# Patient Record
Sex: Female | Born: 1990 | Race: Black or African American | Hispanic: No | Marital: Single | Smoking: Never smoker
Health system: Southern US, Community
[De-identification: ages and names within clinical notes are randomized; demographics above are authoritative.]

## PROBLEM LIST (undated history)

## (undated) DIAGNOSIS — J45909 Unspecified asthma, uncomplicated: Secondary | ICD-10-CM

## (undated) HISTORY — PX: MOUTH SURGERY: SHX715

---
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
## 2013-01-06 ENCOUNTER — Emergency Department: Payer: Self-pay | Admitting: Emergency Medicine

## 2013-01-06 LAB — URINALYSIS, COMPLETE
Bacteria: NONE SEEN
Glucose,UR: NEGATIVE mg/dL (ref 0–75)
Leukocyte Esterase: NEGATIVE
Nitrite: NEGATIVE
RBC,UR: 14 /HPF (ref 0–5)
WBC UR: 1 /HPF (ref 0–5)

## 2013-01-06 LAB — CBC
HCT: 39.7 % (ref 35.0–47.0)
HGB: 13.6 g/dL (ref 12.0–16.0)
MCH: 31.6 pg (ref 26.0–34.0)
MCV: 92 fL (ref 80–100)
RDW: 12 % (ref 11.5–14.5)
WBC: 5 10*3/uL (ref 3.6–11.0)

## 2013-01-06 LAB — COMPREHENSIVE METABOLIC PANEL
Albumin: 4 g/dL (ref 3.4–5.0)
BUN: 10 mg/dL (ref 7–18)
Bilirubin,Total: 0.4 mg/dL (ref 0.2–1.0)
EGFR (Non-African Amer.): >60
Potassium: 4.3 mmol/L (ref 3.5–5.1)
SGOT(AST): 78 U/L — ABNORMAL HIGH (ref 15–37)
SGPT (ALT): 141 U/L — ABNORMAL HIGH (ref 12–78)
Sodium: 138 mmol/L (ref 136–145)

## 2013-01-25 ENCOUNTER — Emergency Department: Payer: Self-pay | Admitting: Emergency Medicine

## 2013-01-25 LAB — COMPREHENSIVE METABOLIC PANEL
Anion Gap: 6 — ABNORMAL LOW (ref 7–16)
Bilirubin,Total: 0.4 mg/dL (ref 0.2–1.0)
Calcium, Total: 9.2 mg/dL (ref 8.5–10.1)
Co2: 27 mmol/L (ref 21–32)
Creatinine: 1.02 mg/dL (ref 0.60–1.30)
EGFR (African American): >60
EGFR (Non-African Amer.): >60
Glucose: 94 mg/dL (ref 65–99)
Osmolality: 277 (ref 275–301)
SGOT(AST): 41 U/L — ABNORMAL HIGH (ref 15–37)
Sodium: 139 mmol/L (ref 136–145)
Total Protein: 9.1 g/dL — ABNORMAL HIGH (ref 6.4–8.2)

## 2013-01-25 LAB — URINALYSIS, COMPLETE
Bilirubin,UR: NEGATIVE
Glucose,UR: NEGATIVE mg/dL (ref 0–75)
Nitrite: NEGATIVE
Ph: 5 (ref 4.5–8.0)
Protein: 100
RBC,UR: 28 /HPF (ref 0–5)
Squamous Epithelial: 20
WBC UR: 97 /HPF (ref 0–5)

## 2013-01-25 LAB — CBC
HGB: 13.5 g/dL (ref 12.0–16.0)
MCHC: 33.6 g/dL (ref 32.0–36.0)
MCV: 92 fL (ref 80–100)
Platelet: 372 10*3/uL (ref 150–440)
WBC: 7.6 10*3/uL (ref 3.6–11.0)

## 2013-01-25 LAB — LIPASE, BLOOD: Lipase: 149 U/L (ref 73–393)

## 2013-01-26 LAB — WET PREP, GENITAL

## 2015-02-03 ENCOUNTER — Emergency Department: Payer: Self-pay | Admitting: Emergency Medicine

## 2015-03-28 ENCOUNTER — Emergency Department
Admit: 2015-03-28 | Disposition: A | Discharge status: Home / Self Care | Payer: Self-pay | Admitting: Emergency Medicine

## 2015-10-05 ENCOUNTER — Emergency Department
Admission: EM | Admit: 2015-10-05 | Discharge: 2015-10-05 | Disposition: A | Discharge status: Home / Self Care | Payer: Federal, State, Local not specified - PPO | Attending: Emergency Medicine | Admitting: Emergency Medicine

## 2015-10-05 ENCOUNTER — Encounter: Payer: Self-pay | Admitting: *Deleted

## 2015-10-05 DIAGNOSIS — S0502XA Injury of conjunctiva and corneal abrasion without foreign body, left eye, initial encounter: Secondary | ICD-10-CM | POA: Diagnosis not present

## 2015-10-05 DIAGNOSIS — S0592XA Unspecified injury of left eye and orbit, initial encounter: Secondary | ICD-10-CM | POA: Diagnosis present

## 2015-10-05 DIAGNOSIS — W228XXA Striking against or struck by other objects, initial encounter: Secondary | ICD-10-CM | POA: Insufficient documentation

## 2015-10-05 DIAGNOSIS — Y998 Other external cause status: Secondary | ICD-10-CM | POA: Insufficient documentation

## 2015-10-05 DIAGNOSIS — Y9389 Activity, other specified: Secondary | ICD-10-CM | POA: Insufficient documentation

## 2015-10-05 DIAGNOSIS — Y9289 Other specified places as the place of occurrence of the external cause: Secondary | ICD-10-CM | POA: Diagnosis not present

## 2015-10-05 MED ORDER — TETRACAINE HCL 0.5 % OP SOLN
2.0000 [drp] | Freq: Once | OPHTHALMIC | Status: AC
  Administered 2015-10-05: 2 [drp] via OPHTHALMIC
  Filled 2015-10-05: qty 2

## 2015-10-05 MED ORDER — FLUORESCEIN SODIUM 1 MG OP STRP
1.0000 | ORAL_STRIP | Freq: Once | OPHTHALMIC | Status: AC
  Administered 2015-10-05: 1 via OPHTHALMIC
  Filled 2015-10-05: qty 1

## 2015-10-05 MED ORDER — ERYTHROMYCIN 5 MG/GM OP OINT
1.0000 | TOPICAL_OINTMENT | Freq: Four times a day (QID) | OPHTHALMIC | Status: DC
Start: 2015-10-05 — End: 2016-04-01

## 2015-10-05 MED ORDER — KETOROLAC TROMETHAMINE 0.5 % OP SOLN: 1.0000 [drp] | Freq: Four times a day (QID) | OPHTHALMIC | Status: DC

## 2015-10-05 NOTE — Discharge Instructions (Signed)

## 2015-10-05 NOTE — ED Provider Notes (Signed)
[facility] Emergency Department Provider Note  ____________________________________________  Time seen: Approximately 1:45 PM  I have reviewed the triage vital signs and the nursing notes.   HISTORY  Chief Complaint Eye Pain    HPI Annette Hicks is a 24 y.o. female who presents to the emergency department for evaluation of left eye pain. She states that approximately a week ago she accidentally hit her eye with a makeup wipe and a few hours later began to feel irritation in the eye. She has been to Dr. Sharman Crate twice and is now on steroid drops. She states that the drops are not helping, they are causing her eye to feel dry and more irritated. She also states that she is taking clindamycin for a sinus infection, but that is not helping her eye either.   History reviewed. No pertinent past medical history.  There are no active problems to display for this patient.   Past Surgical History  Procedure Laterality Date  . Mouth surgery      wisdom teeth removal    Current Outpatient Rx  Name  Route  Sig  Dispense  Refill  . erythromycin ophthalmic ointment   Left Eye   Place 1 application into the left eye 4 (four) times daily.   3.5 g   0   . ketorolac (ACULAR) 0.5 % ophthalmic solution   Left Eye   Place 1 drop into the left eye 4 (four) times daily.   5 mL   0     Allergies Iodine  No family history on file.  Social History Social History  Substance Use Topics  . Smoking status: Never Smoker   . Smokeless tobacco: None  . Alcohol Use: No    Review of Systems   Constitutional: No fever/chills Eyes: Visual changes: yes, blurry in the left eye. ENT: No sore throat. Cardiovascular: Denies chest pain. Respiratory: Denies shortness of breath. Gastrointestinal: No abdominal pain.  No nausea, no vomiting.  No diarrhea.  No constipation. Musculoskeletal: Negative for pain. Skin: Negative for rash. Neurological: Negative for  headaches, focal weakness or numbness. Psychiatric:At baseline, no complaint Lymphatic:Swollen nodes-- no Allergic: Seasonal allergies: no 10-point ROS otherwise negative.  ____________________________________________  PHYSICAL EXAM:  VITAL SIGNS: ED Triage Vitals  Enc Vitals Group     BP 10/05/15 1256 119/63 mmHg     Pulse Rate 10/05/15 1256 82     Resp 10/05/15 1256 18     Temp 10/05/15 1256 98.1 F (36.7 C)     Temp Source 10/05/15 1256 Oral     SpO2 10/05/15 1256 98 %     Weight 10/05/15 1256 120 lb (54.432 kg)     Height 10/05/15 1256  (1.651 m)     Head Cir --      Peak Flow --      Pain Score 10/05/15 1257 8     Pain Loc --      Pain Edu? --      Excl. in GC? --     Constitutional: Alert and oriented. Well appearing and in no acute distress. Eyes: Visual acuity--see nursing documentation; No globe trauma; Eyelids normal to inspection; Everted for exam yes; Conjunctiva and sclera: Erythematous; Corneas: Tiny ulcerations noted in the 9-12 position; Examined with fluorescein yes; EOM's intact; Pupils PERRLA; Anterior Chambers normal with limited exam.  Head: Atraumatic. Nose: No congestion/rhinnorhea. Mouth/Throat: Mucous membranes are moist.  Oropharynx non-erythematous. Neck: No stridor.  Cardiovascular: Normal rate, regular rhythm. Grossly normal  heart sounds.  Good peripheral circulation. Respiratory: Normal respiratory effort.  No retractions. Gastrointestinal: Soft and nontender. No distention. No abdominal bruits. No CVA tenderness. Musculoskeletal:Normal ROM Neurologic:  Normal speech and language. No gross focal neurologic deficits are appreciated. Speech is normal. No gait instability. Skin:  Skin is warm, dry and intact. No rash noted. Psychiatric: Mood and affect are normal. Speech and behavior are normal.  ____________________________________________   LABS (all labs ordered are listed, but only abnormal results are displayed)  Labs Reviewed  - No data to display ____________________________________________  EKG   ____________________________________________  RADIOLOGY  Not indicated. ____________________________________________   PROCEDURES  Procedure(s) performed: None  ____________________________________________   INITIAL IMPRESSION / ASSESSMENT AND PLAN / ED COURSE  Pertinent labs & imaging results that were available during my care of the patient were reviewed by me and considered in my medical decision making (see chart for details).   Patient was advised to follow up with Dr. Sharman CrateBrassington for symptoms that are not improving over the next 2-3 days. She was  also advised to return to the ER for symptoms that change or worsen if unable to schedule an appointment.  ____________________________________________   FINAL CLINICAL IMPRESSION(S) / ED DIAGNOSES  Final diagnoses:  Corneal abrasion, left, initial encounter       Chinita PesterCari B Giancarlo Askren, FNP 10/05/15 1752  Jennye MoccasinBrian S Quigley, MD 10/09/15 [phone]

## 2015-10-05 NOTE — ED Notes (Signed)
Patient c/o pain/redness to left eye that has been present for one week. Patient states now she has a sensitivity to light in the left eye and increased pain when looking to the left. Patient states she has seen the eye doctor who prescribed steroid drops which hasn't helped the pain or redness.

## 2015-12-14 ENCOUNTER — Emergency Department: Payer: Federal, State, Local not specified - PPO

## 2015-12-14 ENCOUNTER — Encounter: Payer: Self-pay | Admitting: Emergency Medicine

## 2015-12-14 ENCOUNTER — Emergency Department
Admission: EM | Admit: 2015-12-14 | Discharge: 2015-12-14 | Disposition: A | Discharge status: Home / Self Care | Payer: Federal, State, Local not specified - PPO | Attending: Emergency Medicine | Admitting: Emergency Medicine

## 2015-12-14 DIAGNOSIS — S99911A Unspecified injury of right ankle, initial encounter: Secondary | ICD-10-CM | POA: Diagnosis not present

## 2015-12-14 DIAGNOSIS — W1849XA Other slipping, tripping and stumbling without falling, initial encounter: Secondary | ICD-10-CM | POA: Insufficient documentation

## 2015-12-14 DIAGNOSIS — Z792 Long term (current) use of antibiotics: Secondary | ICD-10-CM | POA: Insufficient documentation

## 2015-12-14 DIAGNOSIS — S9031XA Contusion of right foot, initial encounter: Secondary | ICD-10-CM | POA: Insufficient documentation

## 2015-12-14 DIAGNOSIS — Z791 Long term (current) use of non-steroidal anti-inflammatories (NSAID): Secondary | ICD-10-CM | POA: Insufficient documentation

## 2015-12-14 DIAGNOSIS — Y9389 Activity, other specified: Secondary | ICD-10-CM | POA: Insufficient documentation

## 2015-12-14 DIAGNOSIS — S90811A Abrasion, right foot, initial encounter: Secondary | ICD-10-CM | POA: Insufficient documentation

## 2015-12-14 DIAGNOSIS — Y9289 Other specified places as the place of occurrence of the external cause: Secondary | ICD-10-CM | POA: Diagnosis not present

## 2015-12-14 DIAGNOSIS — S99921A Unspecified injury of right foot, initial encounter: Secondary | ICD-10-CM | POA: Diagnosis present

## 2015-12-14 DIAGNOSIS — Y998 Other external cause status: Secondary | ICD-10-CM | POA: Diagnosis not present

## 2015-12-14 MED ORDER — NAPROXEN 500 MG PO TABS: 500.0000 mg | ORAL_TABLET | Freq: Two times a day (BID) | ORAL | Status: DC

## 2015-12-14 MED ORDER — OXYCODONE-ACETAMINOPHEN 5-325 MG PO TABS
2.0000 | ORAL_TABLET | Freq: Once | ORAL | Status: AC
  Administered 2015-12-14: 2 via ORAL
  Filled 2015-12-14: qty 2
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
## 2015-12-14 NOTE — Discharge Instructions (Signed)
Foot Contusion  A foot contusion is a deep bruise to the foot. Contusions happen when an injury causes bleeding under the skin. Signs of bruising include pain, puffiness (swelling), and discolored skin. The contusion may turn blue, purple, or yellow. HOME CARE  Put ice on the injured area.  Put ice in a plastic bag.  Place a towel between your skin and the bag.  Leave the ice on for 15-20 minutes, 03-04 times a day.  Only take medicines as told by your doctor.  Use an elastic wrap only as told. You may remove the wrap for sleeping, showering, and bathing. Take the wrap off if you lose feeling (numb) in your toes, or they turn blue or cold. Put the wrap on more loosely.  Keep the foot raised (elevated) with pillows.  If your foot hurts, avoid standing or walking.  When your doctor says it is okay to use your foot, start using it slowly. If you have pain, lessen how much you use your foot.  See your doctor as told. GET HELP RIGHT AWAY IF:   You have more redness, puffiness, or pain in your foot.  Your puffiness or pain does not get better with medicine.  You lose feeling in your foot, or you cannot move your toes.  Your foot turns cold or blue.  You have pain when you move your toes.  Your foot feels warm.  Your contusion does not get better in 2 days. MAKE SURE YOU:   Understand these instructions.  Will watch this condition.  Will get help right away if you or your child is not doing well or gets worse.   This information is not intended to replace advice given to you by your health care provider. Make sure you discuss any questions you have with your health care provider.   Document Released: 08/25/2008 Document Revised: 05/17/2012 Document Reviewed: 07/23/2015 Elsevier Interactive Patient Education 2016 [employer]vier Inc[employer]  

## 2016-04-01 ENCOUNTER — Emergency Department
Admission: EM | Admit: 2016-04-01 | Discharge: 2016-04-01 | Disposition: A | Discharge status: Home / Self Care | Payer: Federal, State, Local not specified - PPO | Attending: Emergency Medicine | Admitting: Emergency Medicine

## 2016-04-01 ENCOUNTER — Emergency Department: Payer: Federal, State, Local not specified - PPO

## 2016-04-01 ENCOUNTER — Encounter: Payer: Self-pay | Admitting: *Deleted

## 2016-04-01 DIAGNOSIS — W3400XA Accidental discharge from unspecified firearms or gun, initial encounter: Secondary | ICD-10-CM | POA: Diagnosis not present

## 2016-04-01 DIAGNOSIS — Y92511 Restaurant or cafe as the place of occurrence of the external cause: Secondary | ICD-10-CM | POA: Insufficient documentation

## 2016-04-01 DIAGNOSIS — S81812A Laceration without foreign body, left lower leg, initial encounter: Secondary | ICD-10-CM | POA: Insufficient documentation

## 2016-04-01 DIAGNOSIS — J45909 Unspecified asthma, uncomplicated: Secondary | ICD-10-CM | POA: Insufficient documentation

## 2016-04-01 DIAGNOSIS — Y939 Activity, unspecified: Secondary | ICD-10-CM | POA: Diagnosis not present

## 2016-04-01 DIAGNOSIS — S81811A Laceration without foreign body, right lower leg, initial encounter: Secondary | ICD-10-CM | POA: Insufficient documentation

## 2016-04-01 DIAGNOSIS — Y999 Unspecified external cause status: Secondary | ICD-10-CM | POA: Insufficient documentation

## 2016-04-01 DIAGNOSIS — S8992XA Unspecified injury of left lower leg, initial encounter: Secondary | ICD-10-CM | POA: Diagnosis present

## 2016-04-01 HISTORY — DX: Unspecified asthma, uncomplicated: J45.909

## 2016-04-01 LAB — TYPE AND SCREEN
ABO/RH(D): B POS
ANTIBODY SCREEN: NEGATIVE

## 2016-04-01 LAB — COMPREHENSIVE METABOLIC PANEL
ALT: 36 U/L (ref 14–54)
ANION GAP: 19 — AB (ref 5–15)
AST: 51 U/L — ABNORMAL HIGH (ref 15–41)
Albumin: 4.9 g/dL (ref 3.5–5.0)
Alkaline Phosphatase: 73 U/L (ref 38–126)
BUN: 14 mg/dL (ref 6–20)
CALCIUM: 9.7 mg/dL (ref 8.9–10.3)
CHLORIDE: 105 mmol/L (ref 101–111)
CO2: 13 mmol/L — AB (ref 22–32)
Creatinine, Ser: 0.97 mg/dL (ref 0.44–1.00)
Glucose, Bld: 142 mg/dL — ABNORMAL HIGH (ref 65–99)
Potassium: 3 mmol/L — ABNORMAL LOW (ref 3.5–5.1)
SODIUM: 137 mmol/L (ref 135–145)
Total Bilirubin: 0.5 mg/dL (ref 0.3–1.2)
Total Protein: 8.7 g/dL — ABNORMAL HIGH (ref 6.5–8.1)

## 2016-04-01 LAB — CBC
HCT: 41.9 % (ref 35.0–47.0)
Hemoglobin: 14.4 g/dL (ref 12.0–16.0)
MCH: 31.7 pg (ref 26.0–34.0)
MCHC: 34.4 g/dL (ref 32.0–36.0)
MCV: 92.3 fL (ref 80.0–100.0)
PLATELETS: 294 10*3/uL (ref 150–440)
RBC: 4.53 MIL/uL (ref 3.80–5.20)
RDW: 12.7 % (ref 11.5–14.5)
WBC: 8.5 10*3/uL (ref 3.6–11.0)

## 2016-04-01 LAB — ABO/RH: ABO/RH(D): B POS

## 2016-04-01 MED ORDER — IOPAMIDOL (ISOVUE-370) INJECTION 76%
125.0000 mL | Freq: Once | INTRAVENOUS | Status: AC | PRN
  Administered 2016-04-01: 120 mL via INTRAVENOUS

## 2016-04-01 MED ORDER — MORPHINE SULFATE (PF) 2 MG/ML IV SOLN
2.0000 mg | Freq: Once | INTRAVENOUS | Status: AC
  Administered 2016-04-01: 2 mg via INTRAVENOUS

## 2016-04-01 MED ORDER — DIPHENHYDRAMINE HCL 50 MG/ML IJ SOLN
25.0000 mg | Freq: Once | INTRAMUSCULAR | Status: AC
  Administered 2016-04-01: 25 mg via INTRAVENOUS

## 2016-04-01 MED ORDER — METHYLPREDNISOLONE SODIUM SUCC 125 MG IJ SOLR
125.0000 mg | Freq: Once | INTRAMUSCULAR | Status: AC
  Administered 2016-04-01: 125 mg via INTRAVENOUS

## 2016-04-01 MED ORDER — CEPHALEXIN 500 MG PO CAPS: 500.0000 mg | ORAL_CAPSULE | Freq: Two times a day (BID) | ORAL | Status: AC

## 2016-04-01 MED ORDER — MORPHINE SULFATE (PF) 2 MG/ML IV SOLN
INTRAVENOUS | Status: AC
  Filled 2016-04-01: qty 1

## 2016-04-01 MED ORDER — LIDOCAINE-EPINEPHRINE (PF) 1 %-1:200000 IJ SOLN
INTRAMUSCULAR | Status: AC
  Filled 2016-04-01: qty 30

## 2016-04-01 MED ORDER — MORPHINE SULFATE (PF) 2 MG/ML IV SOLN
INTRAVENOUS | Status: DC
Start: 2016-04-01 — End: 2016-04-01
  Filled 2016-04-01: qty 1

## 2016-04-01 MED ORDER — CEPHALEXIN 500 MG PO CAPS
500.0000 mg | ORAL_CAPSULE | Freq: Once | ORAL | Status: AC
  Administered 2016-04-01: 500 mg via ORAL
  Filled 2016-04-01: qty 1

## 2016-04-01 MED ORDER — BACITRACIN ZINC 500 UNIT/GM EX OINT
TOPICAL_OINTMENT | CUTANEOUS | Status: AC
  Administered 2016-04-01: 1
  Filled 2016-04-01: qty 2.7

## 2016-04-01 MED ORDER — DIPHENHYDRAMINE HCL 50 MG/ML IJ SOLN
INTRAMUSCULAR | Status: AC
  Filled 2016-04-01: qty 1

## 2016-04-01 MED ORDER — METHYLPREDNISOLONE SODIUM SUCC 125 MG IJ SOLR
INTRAMUSCULAR | Status: AC
  Filled 2016-04-01: qty 2

## 2016-04-01 MED ORDER — OXYCODONE-ACETAMINOPHEN 5-325 MG PO TABS: 1.0000 | ORAL_TABLET | ORAL | Status: DC | PRN
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
## 2016-04-01 NOTE — ED Notes (Signed)
Wounds dressed with antibiotic ointment and gauze dressings after stitches placed. Patient educated on wound care by Dr. Manson PasseyBrown.

## 2016-04-01 NOTE — ED Notes (Signed)
Patient transported to CT 

## 2016-04-02 ENCOUNTER — Telehealth: Payer: Self-pay | Admitting: Emergency Medicine

## 2016-04-02 NOTE — ED Notes (Signed)
Called patient at request of Dr. Manson PasseyBrown to check on condition and follow up plans.  Patient says she had a lot of pain yesterday and says she feels the medication is not strong enough.  Says she had sprained ankle in the past and got stronger dose and got better right away.  i explained that this would take longer than a sprained ankle for healing to occur and that I could not give her stronger medication over the phone.  I advised her to call pcp.    I realized that the person I was speaking to was now not the patient.  i asked who this was, and the person says she is the patient's sister and she just took the phone from the patient.  The sister is asking about stronger pain meds, i told her that i had advised the patient to call pcp at [facility] to see if they could assist her. The sister asks if the patient needs to return to the ED.  I told her that was the patient's decision, and that if she returned she would be evaluated.  The decision on pain treatment would rest with the physician.  I asked if the patient would like to talk to me again and realized the sister had hung up the phone.

## 2016-07-14 ENCOUNTER — Encounter: Payer: Self-pay | Admitting: Emergency Medicine

## 2016-07-14 ENCOUNTER — Emergency Department: Payer: Federal, State, Local not specified - PPO

## 2016-07-14 ENCOUNTER — Emergency Department
Admission: EM | Admit: 2016-07-14 | Discharge: 2016-07-14 | Disposition: A | Discharge status: Home / Self Care | Payer: Federal, State, Local not specified - PPO | Attending: Emergency Medicine | Admitting: Emergency Medicine

## 2016-07-14 DIAGNOSIS — N390 Urinary tract infection, site not specified: Secondary | ICD-10-CM | POA: Diagnosis not present

## 2016-07-14 DIAGNOSIS — J45909 Unspecified asthma, uncomplicated: Secondary | ICD-10-CM | POA: Insufficient documentation

## 2016-07-14 DIAGNOSIS — M545 Low back pain, unspecified: Secondary | ICD-10-CM

## 2016-07-14 DIAGNOSIS — R102 Pelvic and perineal pain: Secondary | ICD-10-CM

## 2016-07-14 LAB — URINALYSIS COMPLETE WITH MICROSCOPIC (ARMC ONLY)
Bacteria, UA: NONE SEEN
Bilirubin Urine: NEGATIVE
Glucose, UA: NEGATIVE mg/dL
HGB URINE DIPSTICK: NEGATIVE
KETONES UR: NEGATIVE mg/dL
Nitrite: NEGATIVE
PH: 6 (ref 5.0–8.0)
PROTEIN: NEGATIVE mg/dL
SPECIFIC GRAVITY, URINE: 1.026 (ref 1.005–1.030)

## 2016-07-14 LAB — COMPREHENSIVE METABOLIC PANEL
ALBUMIN: 4.4 g/dL (ref 3.5–5.0)
ALK PHOS: 65 U/L (ref 38–126)
ALT: 25 U/L (ref 14–54)
AST: 21 U/L (ref 15–41)
Anion gap: 9 (ref 5–15)
BUN: 14 mg/dL (ref 6–20)
CALCIUM: 9.4 mg/dL (ref 8.9–10.3)
CHLORIDE: 102 mmol/L (ref 101–111)
CO2: 27 mmol/L (ref 22–32)
CREATININE: 0.8 mg/dL (ref 0.44–1.00)
GFR calc Af Amer: >60 mL/min (ref >60)
GFR calc non Af Amer: >60 mL/min (ref >60)
GLUCOSE: 86 mg/dL (ref 65–99)
Potassium: 3.6 mmol/L (ref 3.5–5.1)
SODIUM: 138 mmol/L (ref 135–145)
Total Bilirubin: 0.4 mg/dL (ref 0.3–1.2)
Total Protein: 7.5 g/dL (ref 6.5–8.1)

## 2016-07-14 LAB — CHLAMYDIA/NGC RT PCR (ARMC ONLY)
CHLAMYDIA TR: NOT DETECTED
N GONORRHOEAE: NOT DETECTED

## 2016-07-14 LAB — CBC WITH DIFFERENTIAL/PLATELET
BASOS ABS: 0 10*3/uL (ref 0–0.1)
BASOS PCT: 1 %
EOS ABS: 0 10*3/uL (ref 0–0.7)
EOS PCT: 1 %
HCT: 40.9 % (ref 35.0–47.0)
Hemoglobin: 14.2 g/dL (ref 12.0–16.0)
LYMPHS PCT: 29 %
Lymphs Abs: 1.6 10*3/uL (ref 1.0–3.6)
MCH: 32.3 pg (ref 26.0–34.0)
MCHC: 34.7 g/dL (ref 32.0–36.0)
MCV: 93.1 fL (ref 80.0–100.0)
MONO ABS: 0.5 10*3/uL (ref 0.2–0.9)
Monocytes Relative: 9 %
Neutro Abs: 3.3 10*3/uL (ref 1.4–6.5)
Neutrophils Relative %: 60 %
PLATELETS: 238 10*3/uL (ref 150–440)
RBC: 4.39 MIL/uL (ref 3.80–5.20)
RDW: 11.8 % (ref 11.5–14.5)
WBC: 5.4 10*3/uL (ref 3.6–11.0)

## 2016-07-14 LAB — WET PREP, GENITAL
CLUE CELLS WET PREP: NONE SEEN
SPERM: NONE SEEN
Trich, Wet Prep: NONE SEEN
YEAST WET PREP: NONE SEEN

## 2016-07-14 LAB — POCT PREGNANCY, URINE: PREG TEST UR: NEGATIVE

## 2016-07-14 LAB — LIPASE, BLOOD: Lipase: 36 U/L (ref 11–51)

## 2016-07-14 MED ORDER — CARISOPRODOL 350 MG PO TABS: 350.0000 mg | ORAL_TABLET | Freq: Three times a day (TID) | ORAL | 0 refills | Status: AC | PRN

## 2016-07-14 MED ORDER — ACETAMINOPHEN 325 MG PO TABS
650.0000 mg | ORAL_TABLET | Freq: Once | ORAL | Status: AC
  Administered 2016-07-14: 650 mg via ORAL
  Filled 2016-07-14: qty 2

## 2016-07-14 MED ORDER — CEPHALEXIN 500 MG PO CAPS: 500.0000 mg | ORAL_CAPSULE | Freq: Three times a day (TID) | ORAL | 0 refills | Status: AC

## 2016-07-14 MED ORDER — CEPHALEXIN 500 MG PO CAPS
500.0000 mg | ORAL_CAPSULE | Freq: Once | ORAL | Status: AC
  Administered 2016-07-14: 500 mg via ORAL

## 2016-07-14 MED ORDER — CEPHALEXIN 500 MG PO CAPS
ORAL_CAPSULE | ORAL | Status: AC
  Administered 2016-07-14: 500 mg via ORAL
  Filled 2016-07-14: qty 1

## 2016-07-14 NOTE — ED Provider Notes (Signed)
[facility] Emergency Department Provider Note   ____________________________________________   First MD Initiated Contact with Patient 07/14/16 1941     (approximate)  I have reviewed the triage vital signs and the nursing notes.   HISTORY  Chief Complaint Abdominal Pain and Back Pain   HPI Annette Hicks is a 25 y.o. female with a history of asthma who is presenting to the emergency department with 1-2 of cramping in her back that is begun to radiate around into her abdomen. She denies any burning with urination. Denies any vaginal bleeding or discharge. Says that she is about to have her period and she felt that was her period coming on but usually her premenstrual cramps do not last for this lengthy period of time. She has no history of STDs but is considering them as a cause for her pain at this time. Says that her pain is 9 out of 10 and worse with movement. Denies any nausea vomiting or diarrhea. Patient has a history of ovarian cysts and says that she has felt similarly in the past when she has had one.   Past Medical History:  Diagnosis Date  . Asthma     There are no active problems to display for this patient.   Past Surgical History:  Procedure Laterality Date  . MOUTH SURGERY     wisdom teeth removal    Prior to Admission medications   Medication Sig Start Date End Date Taking? Authorizing Provider  oxyCODONE-acetaminophen (ROXICET) 5-325 MG tablet Take 1 tablet by mouth every 4 (four) hours as needed for severe pain. 04/01/16   Darci Currentandolph N Brown, MD    Allergies Gadolinium derivatives and Iodine  History reviewed. No pertinent family history.  Social History Social History  Substance Use Topics  . Smoking status: Never Smoker  . Smokeless tobacco: Never Used  . Alcohol use Yes    Review of Systems Constitutional: No fever/chills Eyes: No visual changes. ENT: No sore throat. Cardiovascular: Denies chest  pain. Respiratory: Denies shortness of breath. Gastrointestinal   No nausea, no vomiting.  No diarrhea.  No constipation. Genitourinary: Negative for dysuria. Musculoskeletal: As above Skin: Negative for rash. Neurological: Negative for headaches, focal weakness or numbness.  10-point ROS otherwise negative.  ____________________________________________   PHYSICAL EXAM:  VITAL SIGNS: ED Triage Vitals [07/14/16 1923]  Enc Vitals Group     BP 113/61     Pulse Rate 76     Resp 18     Temp 98.5 F (36.9 C)     Temp Source Oral     SpO2 99 %     Weight 125 lb (56.7 kg)     Height 5\' 5"  (1.651 m)     Head Circumference      Peak Flow      Pain Score 10     Pain Loc      Pain Edu?      Excl. in GC?     Constitutional: Alert and oriented. Well appearing and in no acute distress. Eyes: Conjunctivae are normal. PERRL. EOMI. Head: Atraumatic. Nose: No congestion/rhinnorhea. Mouth/Throat: Mucous membranes are moist.   Neck: No stridor.   Cardiovascular: Normal rate, regular rhythm. Grossly normal heart sounds.   Respiratory: Normal respiratory effort.  No retractions. Lungs CTAB. Gastrointestinal: Soft and nontender. No distention.  No CVA tenderness. Genitourinary: Normal external appearance. Speculum exam with small amount of white discharge. Bimanual exam without CMT. No uterine nor adnexal tenderness nor masses. Musculoskeletal:  No lower extremity tenderness nor edema.  No joint effusions.No tenderness to the thoracic or lumbar spinal regions. Neurologic:  Normal speech and language. No gross focal neurologic deficits are appreciated. No gait instability. Skin:  Skin is warm, dry and intact. No rash noted. Psychiatric: Mood and affect are normal. Speech and behavior are normal.  ____________________________________________   LABS (all labs ordered are listed, but only abnormal results are displayed)  Labs Reviewed  WET PREP, GENITAL - Abnormal; Notable for the  following:       Result Value   WBC, Wet Prep HPF POC FEW (*)    All other components within normal limits  URINALYSIS COMPLETEWITH MICROSCOPIC ([facility] ONLY) - Abnormal; Notable for the following:    Color, Urine YELLOW (*)    APPearance CLEAR (*)    Leukocytes, UA TRACE (*)    Squamous Epithelial / LPF 0-5 (*)    All other components within normal limits  CHLAMYDIA/NGC RT PCR ([facility] ONLY)  URINE CULTURE  CBC WITH DIFFERENTIAL/PLATELET  COMPREHENSIVE METABOLIC PANEL  LIPASE, BLOOD  POC URINE PREG, ED  POCT PREGNANCY, URINE   ____________________________________________  EKG   ____________________________________________  RADIOLOGY  US Transvaginal Non-OB (Accession [id number]) (Order [id number])  Imaging  Date: 07/14/2016 Department: [facility] EMERGENCY DEPARTMENT Released By/Authorizing: Myrna Blazeravid Matthew Schaevitz, MD (auto-released)  PACS Images   Show images for US Transvaginal Non-OB  Study Result   CLINICAL DATA:  Pelvic pain in female for 1 week.  EXAM: TRANSABDOMINAL AND TRANSVAGINAL ULTRASOUND OF PELVIS  DOPPLER ULTRASOUND OF OVARIES  TECHNIQUE: Both transabdominal and transvaginal ultrasound examinations of the pelvis were performed. Transabdominal technique was performed for global imaging of the pelvis including uterus, ovaries, adnexal regions, and pelvic cul-de-sac.  It was necessary to proceed with endovaginal exam following the transabdominal exam to visualize the adnexa and endometrium. Color and duplex Doppler ultrasound was utilized to evaluate blood flow to the ovaries.  COMPARISON:  CT abdomen and pelvis 01/26/2013. Pelvic ultrasound 01/26/2013.  FINDINGS: Uterus  Measurements: 5.8 x 3.9 x 5.0 cm, within normal limits. No fibroids or other mass visualized.  Endometrium  Thickness: 13 mm, upper limits of normal. No focal abnormality visualized.  Right ovary  Measurements: 3.4 x 1.8 x 3.0 cm,  within normal limits. Normal appearance/no adnexal mass.  Left ovary  Measurements: 4.0 x 2.0 x 2.4 cm, within normal limits. Normal appearance/no adnexal mass.  Pulsed Doppler evaluation of both ovaries demonstrates normal low-resistance arterial and venous waveforms.  Other findings  No abnormal free fluid.  IMPRESSION: 1. Normal premenopausal appearance of the uterus. 2. Normal appearance of the ovaries bilaterally including pulsed Doppler evaluation with normal arterial and venous waveforms.   Electronically Signed   By: Marin Robertshristopher  Mattern M.D.   On: 07/14/2016 22:28     ____________________________________________   PROCEDURES  Procedure(s) performed:   Procedures  Critical Care performed:   ____________________________________________   INITIAL IMPRESSION / ASSESSMENT AND PLAN / ED COURSE  Pertinent labs & imaging results that were available during my care of the patient were reviewed by me and considered in my medical decision making (see chart for details).  ----------------------------------------- 10:51 PM on 07/14/2016 -----------------------------------------  Patient is resting comfortably at this time. Very reassuring lab results. Possible very mild urinary tract infection but normal ultrasound pelvis as well as wet prep. Possible musculoskeletal pain. The patient appears to be able to find a comfortable position. There was no tenderness on palpation to the patient's thoracic or lumbar spine  area. There was no midline deformity or step-off. The patient isn't wearing flat shoes without concussion. She does do a lot of time on her feet and shortness of bartender. I recommended using a cushioned shoes such as a sneaker.  She'll continue to use ibuprofen. I'll prescribe her Annette Garter as well as Keflex for a mild urinary tract infection. She is understanding of plan and willing to comply. Will be discharged home.  Clinical Course      ____________________________________________   FINAL CLINICAL IMPRESSION(S) / ED DIAGNOSES  Final diagnoses:  Pelvic pain in female  Pelvic pain in female  Lumbar back pain. UTI.    NEW MEDICATIONS STARTED DURING THIS VISIT:  New Prescriptions   No medications on file     Note:  This document was prepared using Dragon voice recognition software and may include unintentional dictation errors.    Myrna Blazer, MD 07/14/16 2252

## 2016-07-14 NOTE — ED Triage Notes (Addendum)
Pt presents to ED with c/o lower back pain for about a week with worsening lower abd pain. Ibuprofen and heating pad not helping. Pt states she thought her pain was due to her period getting ready to start (due to start 8/17) but reports she felt dizzy today after standing quickly from a crouched position so she felt she should get checked out. Denies n/v/d or urinary symptoms. Pt has no increased work of breathing or distress noted. Skin warm and dry.

## 2016-07-17 LAB — URINE CULTURE

## 2016-12-25 ENCOUNTER — Ambulatory Visit: Payer: Federal, State, Local not specified - PPO | Attending: Physical Therapy | Admitting: Physical Therapy

## 2016-12-26 IMAGING — CR DG FOOT COMPLETE 3+V*R*
1 series · 3 of 3 positions shown · non-contrast
Comparison: None.

CLINICAL DATA: Right foot injury this morning with dorsal abrasion.

EXAM:
RIGHT FOOT COMPLETE - 3+ VIEW

[Series 1: x foot ap right · 0.14mm/px · 3 of 3 slices shown]
[im 1/3]
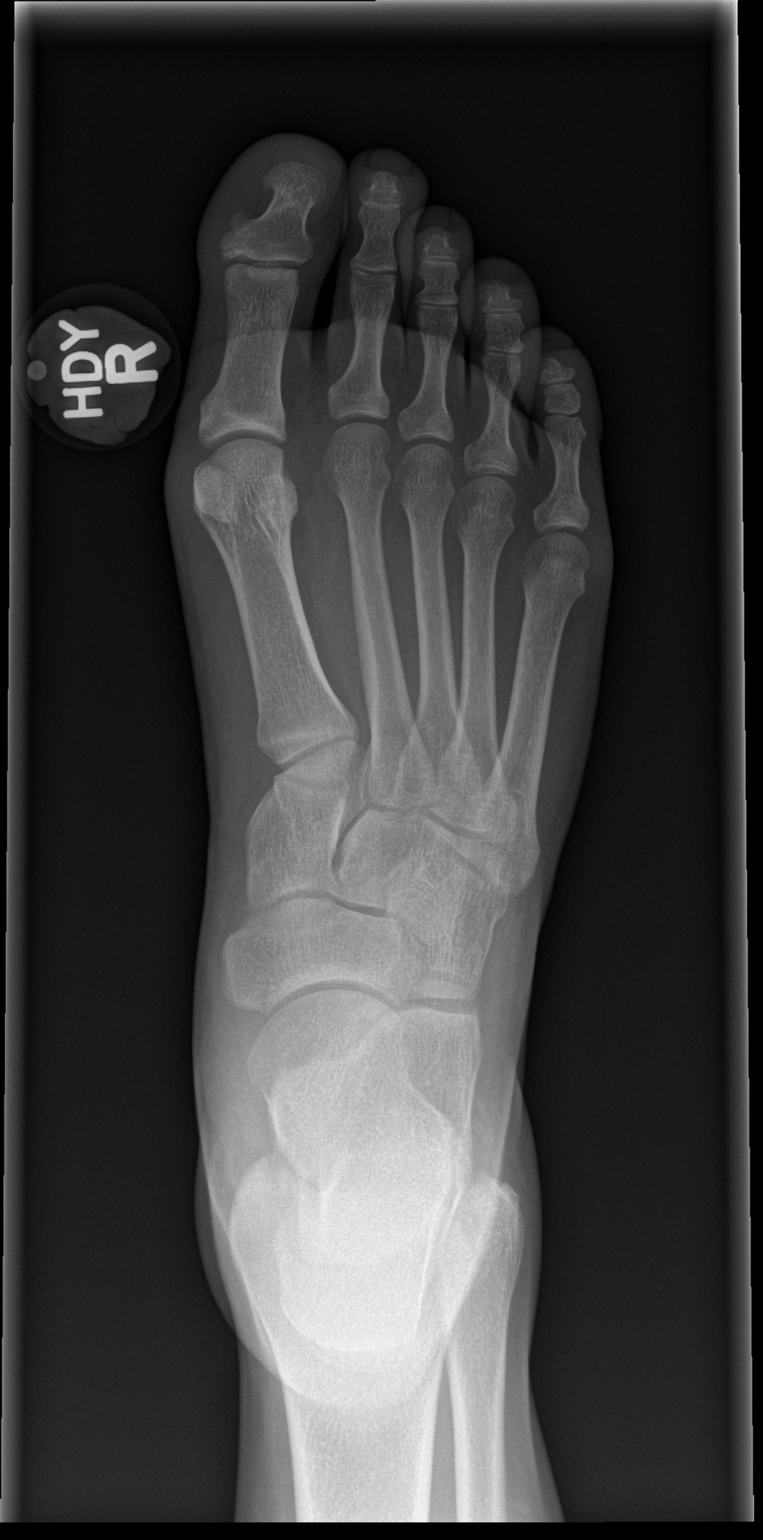
[im 2/3]
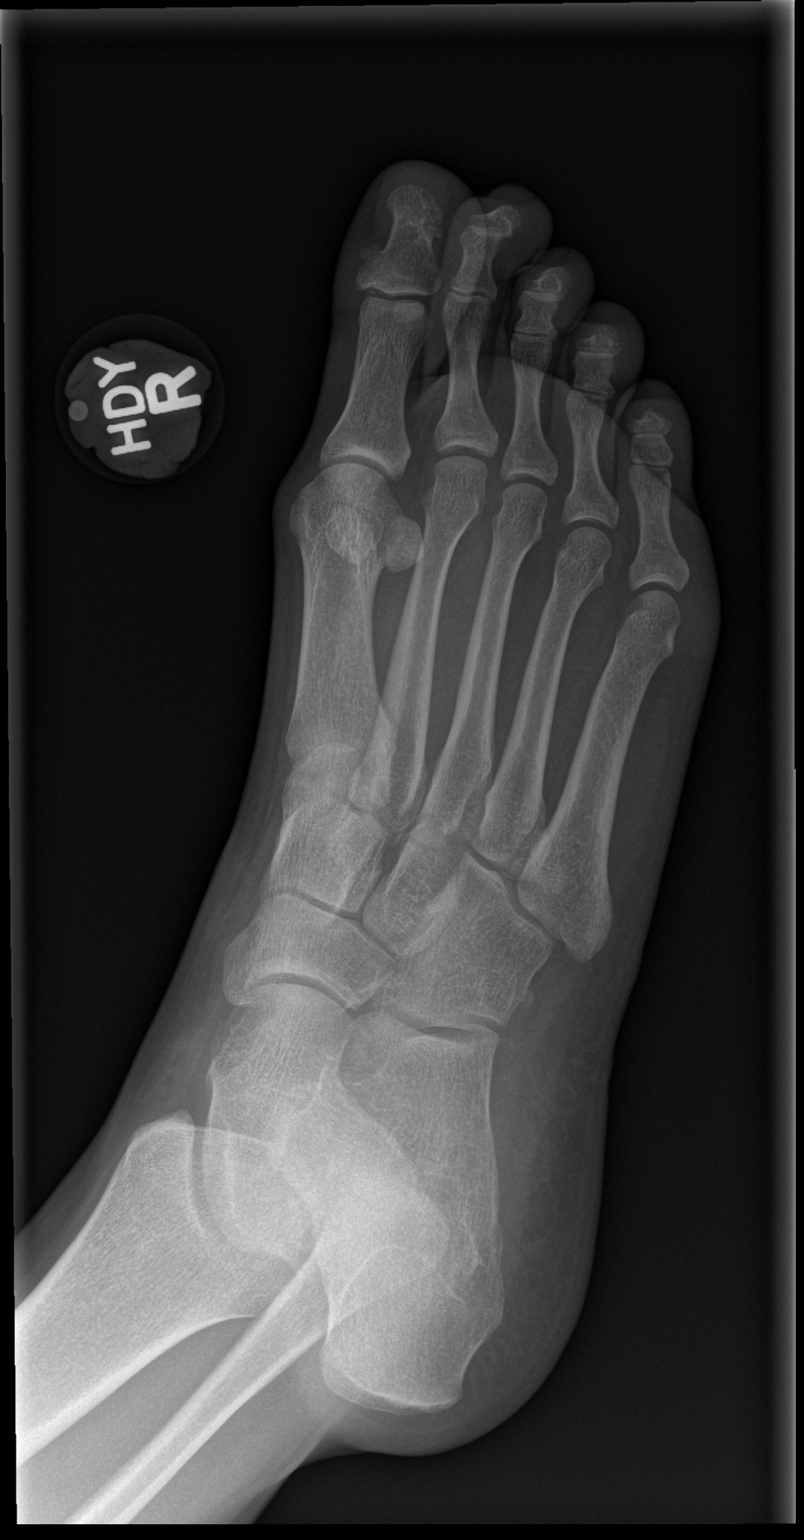
[im 3/3]
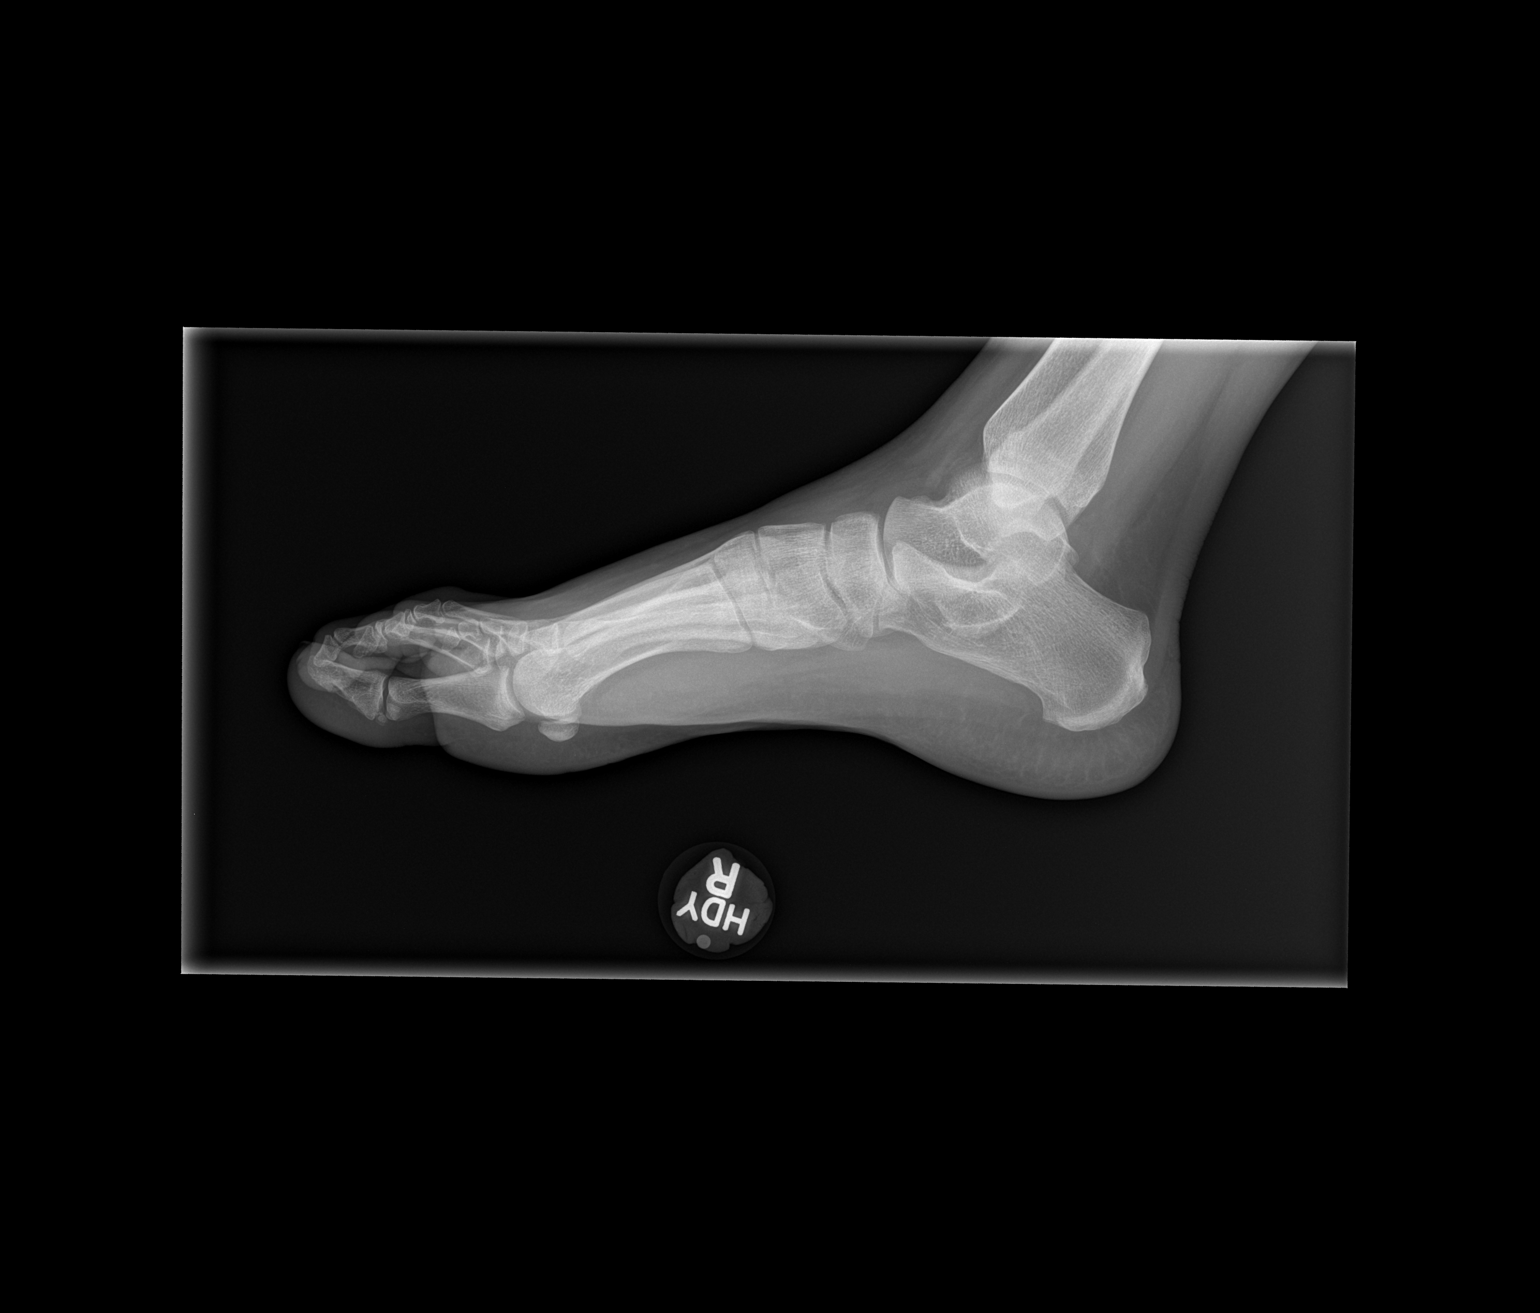

[3 of 3 positions shown; findings below may reference images not displayed]

FINDINGS: There is no evidence of fracture or dislocation. There is no
evidence of arthropathy or other focal bone abnormality. Soft
tissues are unremarkable.
IMPRESSION: Negative.

## 2017-01-08 ENCOUNTER — Encounter: Payer: Self-pay | Admitting: Emergency Medicine

## 2017-01-08 ENCOUNTER — Emergency Department
Admission: EM | Admit: 2017-01-08 | Discharge: 2017-01-08 | Disposition: A | Discharge status: Home / Self Care | Payer: Federal, State, Local not specified - PPO | Attending: Emergency Medicine | Admitting: Emergency Medicine

## 2017-01-08 DIAGNOSIS — J45909 Unspecified asthma, uncomplicated: Secondary | ICD-10-CM | POA: Insufficient documentation

## 2017-01-08 DIAGNOSIS — T7840XA Allergy, unspecified, initial encounter: Secondary | ICD-10-CM

## 2017-01-08 DIAGNOSIS — T782XXA Anaphylactic shock, unspecified, initial encounter: Secondary | ICD-10-CM | POA: Insufficient documentation

## 2017-01-08 MED ORDER — FAMOTIDINE 20 MG PO TABS
20.0000 mg | ORAL_TABLET | Freq: Once | ORAL | Status: AC
  Administered 2017-01-08: 20 mg via ORAL
  Filled 2017-01-08: qty 1

## 2017-01-08 MED ORDER — EPINEPHRINE 0.3 MG/0.3ML IJ SOAJ: 0.3000 mg | Freq: Once | INTRAMUSCULAR | 2 refills | Status: AC

## 2017-01-08 MED ORDER — PREDNISONE 10 MG PO TABS: ORAL_TABLET | ORAL | 0 refills | Status: DC

## 2017-01-08 MED ORDER — DIPHENHYDRAMINE HCL 25 MG PO CAPS
25.0000 mg | ORAL_CAPSULE | Freq: Once | ORAL | Status: AC
  Administered 2017-01-08: 25 mg via ORAL
  Filled 2017-01-08: qty 1

## 2017-01-08 MED ORDER — ALBUTEROL SULFATE HFA 108 (90 BASE) MCG/ACT IN AERS
2.0000 | INHALATION_SPRAY | Freq: Four times a day (QID) | RESPIRATORY_TRACT | 0 refills | Status: AC | PRN
Start: 2017-01-08 — End: ?

## 2017-01-08 NOTE — ED Provider Notes (Signed)
[facility] Emergency Department Provider Note ____________________________________________   I have reviewed the triage vital signs and the triage nursing note.  HISTORY  Chief Complaint Allergic Reaction   Historian Patient  HPI Annette Hicks is a 26 y.o. female with a history of seasonal allergies, who was previously prescribed an EpiPen, but without frank known anaphylactic triggers, [facility] While she was eating she started is experiencing central chest pressure which she initially thought was due to eating too fast. Then she started to have some breathing problems and thought maybe she was having an asthma attack. She went home looking for her inhaler which she cannot fine, and then started experiencing tingling of her lips and swelling of her face and started to think that she is having an allergic reaction. She found her expired EpiPen and used it. She used it at around 9 AM and presented to the ER while over an hour later. She is feeling much better at this time, no longer having shortness breath or chest discomfort, but she still has some swelling of the periorbital soft tissues.  She is requesting refill on injectable epinephrine as well asalbuterol inhaler.  Prior to using her EpiPen, she did take an extra/left over 50 mg tablet of prednisone, and she also took a tablet of Allegra.    Past Medical History:  Diagnosis Date  . Asthma     There are no active problems to display for this patient.   Past Surgical History:  Procedure Laterality Date  . MOUTH SURGERY     wisdom teeth removal    Prior to Admission medications   Medication Sig Start Date End Date Taking? Authorizing Provider  albuterol (PROVENTIL HFA;VENTOLIN HFA) 108 (90 Base) MCG/ACT inhaler Inhale 2 puffs into the lungs every 6 (six) hours as needed for wheezing or shortness of breath. 01/08/17   Governor Rooksebecca Davinia Riccardi, MD  carisoprodol (SOMA)  350 MG tablet Take 1 tablet (350 mg total) by mouth 3 (three) times daily as needed for muscle spasms. Patient not taking: Reported on 01/08/2017 07/14/16 07/14/17  Myrna Blazeravid Matthew Schaevitz, MD  EPINEPHrine (AUVI-Q) 0.3 mg/0.3 mL IJ SOAJ injection Inject 0.3 mLs (0.3 mg total) into the muscle once. 01/08/17 01/08/17  Governor Rooksebecca Jalin Alicea, MD  EPINEPHrine (EPIPEN 2-PAK) 0.3 mg/0.3 mL IJ SOAJ injection Inject 0.3 mLs (0.3 mg total) into the muscle once. 01/08/17 01/08/17  Governor Rooksebecca Nessa Ramaker, MD  oxyCODONE-acetaminophen (ROXICET) 5-325 MG tablet Take 1 tablet by mouth every 4 (four) hours as needed for severe pain. Patient not taking: Reported on 01/08/2017 04/01/16   Darci Currentandolph N Brown, MD  predniSONE (DELTASONE) 10 MG tablet 40mg  by mouth for 2 days 30mg  for 2 days 20mg  for 2 days 10mg  for 2 days 01/08/17   Governor Rooksebecca Loyd Marhefka, MD    Allergies  Allergen Reactions  . Gadolinium Derivatives Swelling  . Iodine     IV contrast dye  . Radish [Raphanus Sativus]     No family history on file.  Social History Social History  Substance Use Topics  . Smoking status: Never Smoker  . Smokeless tobacco: Never Used  . Alcohol use Yes    Review of Systems  Constitutional: Negative for fever. Eyes: Negative for visual changes.  Swelling and puffiness around the eyes. ENT: Negative for sore throat. Cardiovascular: Chest pressure as per history of present illness, no pleuritic chest pain, no ongoing chest pain, relieved. Respiratory: No recent cough congestion or shortness of breath, shortness of  breath/wheezing earlier, now resolved.. Gastrointestinal: Negative for abdominal pain, vomiting and diarrhea. Genitourinary: Negative for dysuria. Musculoskeletal: Negative for back pain. Skin: Negative for rash. Neurological: Negative for headache. 10 point Review of Systems otherwise negative ____________________________________________   PHYSICAL EXAM:  VITAL SIGNS: ED Triage Vitals [01/08/17 1057]  Enc Vitals Group     BP 109/75      Pulse Rate 92     Resp 16     Temp 97.1 F (36.2 C)     Temp Source Oral     SpO2 100 %     Weight 130 lb (59 kg)     Height 5\' 5"  (1.651 m)     Head Circumference      Peak Flow      Pain Score 0     Pain Loc      Pain Edu?      Excl. in GC?      Constitutional: Alert and oriented. Well appearing and in no distress. HEENT   Head: Normocephalic and atraumatic.      Eyes: Conjunctivae are normal. PERRL. Normal extraocular movements.  Periorbital soft tissue swelling especially the right with comparison to the left.      Ears:         Nose: No congestion/rhinnorhea.   Mouth/Throat: Mucous membranes are moist.   Neck: No stridor. Cardiovascular/Chest: Normal rate, regular rhythm.  No murmurs, rubs, or gallops. Respiratory: Normal respiratory effort without tachypnea nor retractions. Breath sounds are clear and equal bilaterally. No wheezes/rales/rhonchi. Gastrointestinal: Soft. No distention, no guarding, no rebound. Nontender.    Genitourinary/rectal:Deferred Musculoskeletal: Nontender with normal range of motion in all extremities. No joint effusions.  No lower extremity tenderness.  No edema. Neurologic:  Normal speech and language. No gross or focal neurologic deficits are appreciated. Skin:  Skin is warm, dry and intact. No rash noted. Psychiatric: Mood and affect are normal. Speech and behavior are normal. Patient exhibits appropriate insight and judgment.   ____________________________________________  LABS (pertinent positives/negatives)  Labs Reviewed - No data to display  ____________________________________________    EKG I, Governor Rooks, MD, the attending physician have personally viewed and interpreted all ECGs.  80 beats per minute. normal sinus rhythm. Narrow QRS. Normal axis. Normal ST and T-wave ____________________________________________  RADIOLOGY All Xrays were viewed by me. Imaging interpreted by  Radiologist.  None __________________________________________  PROCEDURES  Procedure(s) performed: None  Critical Care performed: None  ____________________________________________   ED COURSE / ASSESSMENT AND PLAN  Pertinent labs & imaging results that were available during my care of the patient were reviewed by me and considered in my medical decision making (see chart for details).   Ms. Youngren arrived after what sounds like an anaphylactic reaction to uncertain trigger, but much improved clinically after she took epinephrine injectable at home.  She still has some mild perioral swelling, but no airway complaints. Normal and stable vital signs.  She is requesting albuterol refill for her history of asthma which I'll provide.  She'll be took a dose of 50 mg prednisone this morning. I'm going to give her dose of by mouth Zantac and Benadryl.  We discussed potential of biphasic reaction. Given that started been 2 hours since epinephrine, she is to be observed short period here in the emergency department and discharged home. She will go directly to the pharmacy and pick up a new epinephrine auto injector.  Recommended to have allergy workup, ENT and primary care office numbers provided.   CONSULTATIONS:  None   Patient / Family / Caregiver informed of clinical course, medical decision-making process, and agree with plan.   I discussed return precautions, follow-up instructions, and discharge instructions with patient and/or family.   ___________________________________________   FINAL CLINICAL IMPRESSION(S) / ED DIAGNOSES   Final diagnoses:  Anaphylaxis, initial encounter  Allergic reaction, initial encounter              Note: This dictation was prepared with Dragon dictation. Any transcriptional errors that result from this process are unintentional    Governor Rooks, MD 01/08/17 2115

## 2017-01-08 NOTE — Discharge Instructions (Signed)
You were evaluated after anaphylaxis episode.  As we discussed, there is always a slight possibility of return of anaphylaxis/allergic symptoms. Return to the emergency department immediately for any worsening condition including trouble breathing, facial swelling, throat swelling, dizziness or passing out, or any other symptoms concerning to you.  As we discussed, go directly and pick up a new prescription for an injectable epinephrine to carry with you at all times, and use as we discussed.  Give yourself injectable epinephrine if you have any airway symptoms such as trouble swallowing or trouble breathing, blood pressure/cardiovascular system symptoms such as dizziness or passing out, or "2 systems involved" for example skin system has hives or rash plus GI system has nausea and vomiting or diarrhea.

## 2017-01-08 NOTE — ED Triage Notes (Signed)
Allergic reaction this am.  Swelling face and had chest pain and breathing diff.  Used epipen and feels better.  Also took Careers adviserallegra.

## 2017-01-22 ENCOUNTER — Emergency Department: Payer: No Typology Code available for payment source

## 2017-01-22 ENCOUNTER — Encounter: Payer: Self-pay | Admitting: Emergency Medicine

## 2017-01-22 ENCOUNTER — Emergency Department
Admission: EM | Admit: 2017-01-22 | Discharge: 2017-01-22 | Disposition: A | Discharge status: Home / Self Care | Payer: No Typology Code available for payment source | Attending: Emergency Medicine | Admitting: Emergency Medicine

## 2017-01-22 DIAGNOSIS — Z79899 Other long term (current) drug therapy: Secondary | ICD-10-CM | POA: Insufficient documentation

## 2017-01-22 DIAGNOSIS — Y999 Unspecified external cause status: Secondary | ICD-10-CM | POA: Diagnosis not present

## 2017-01-22 DIAGNOSIS — S161XXA Strain of muscle, fascia and tendon at neck level, initial encounter: Secondary | ICD-10-CM | POA: Diagnosis not present

## 2017-01-22 DIAGNOSIS — S0990XA Unspecified injury of head, initial encounter: Secondary | ICD-10-CM | POA: Diagnosis present

## 2017-01-22 DIAGNOSIS — Y92410 Unspecified street and highway as the place of occurrence of the external cause: Secondary | ICD-10-CM | POA: Diagnosis not present

## 2017-01-22 DIAGNOSIS — Y9389 Activity, other specified: Secondary | ICD-10-CM | POA: Insufficient documentation

## 2017-01-22 DIAGNOSIS — J45909 Unspecified asthma, uncomplicated: Secondary | ICD-10-CM | POA: Insufficient documentation

## 2017-01-22 NOTE — ED Notes (Signed)
Signature pad not working.  Patient verbalized understanding of discharge instructions and has no further questions. 
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
## 2017-07-23 IMAGING — US US TRANSVAGINAL NON-OB
1 series · 13 of 25 positions shown · non-contrast
Comparison: CT abdomen and pelvis 01/26/2013. Pelvic ultrasound
01/26/2013.

CLINICAL DATA: Pelvic pain in female for 1 week.

EXAM:
TRANSABDOMINAL AND TRANSVAGINAL ULTRASOUND OF PELVIS
DOPPLER ULTRASOUND OF OVARIES
TECHNIQUE: Both transabdominal and transvaginal ultrasound examinations of the
pelvis were performed. Transabdominal technique was performed for
global imaging of the pelvis including uterus, ovaries, adnexal
regions, and pelvic cul-de-sac.
It was necessary to proceed with endovaginal exam following the
transabdominal exam to visualize the adnexa and endometrium. Color
and duplex Doppler ultrasound was utilized to evaluate blood flow to
the ovaries.

[Series 1: us transvaginal non-ob · 0.20mm/px · 13 of 65 slices shown]
[im 1/65]
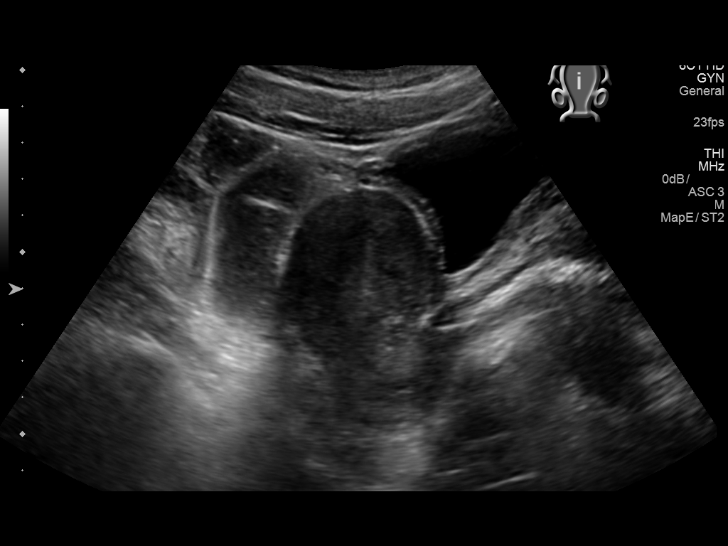
[im 6/65]
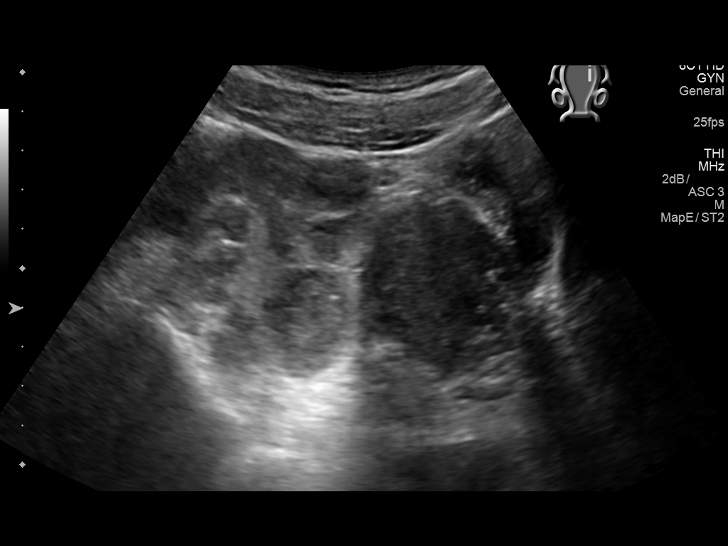
[im 11/65]
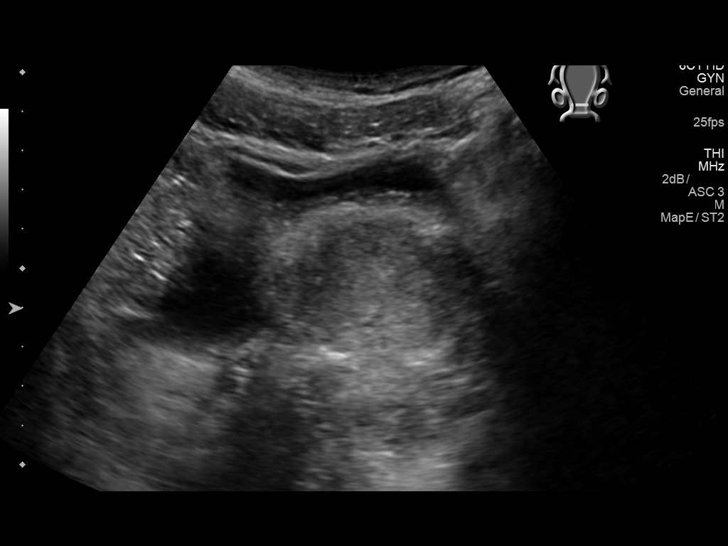
[im 17/65]
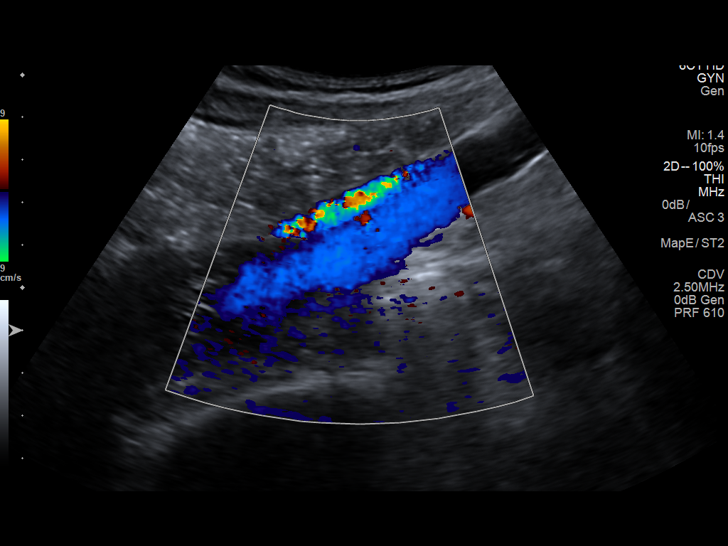
[im 22/65]
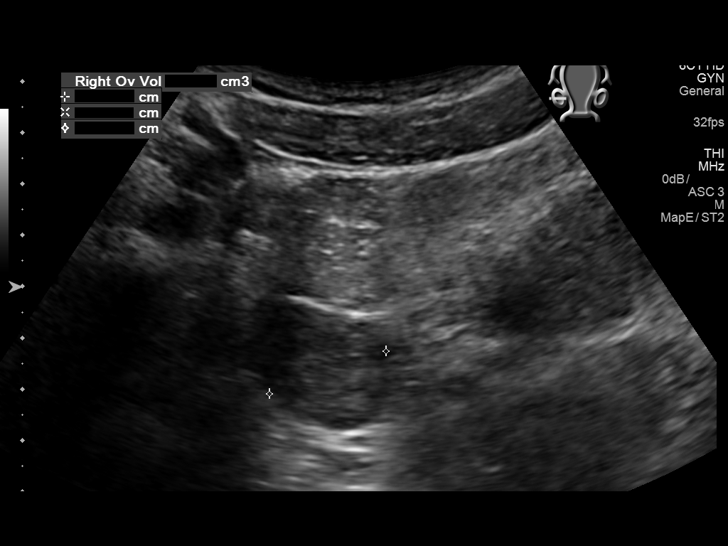
[im 27/65]
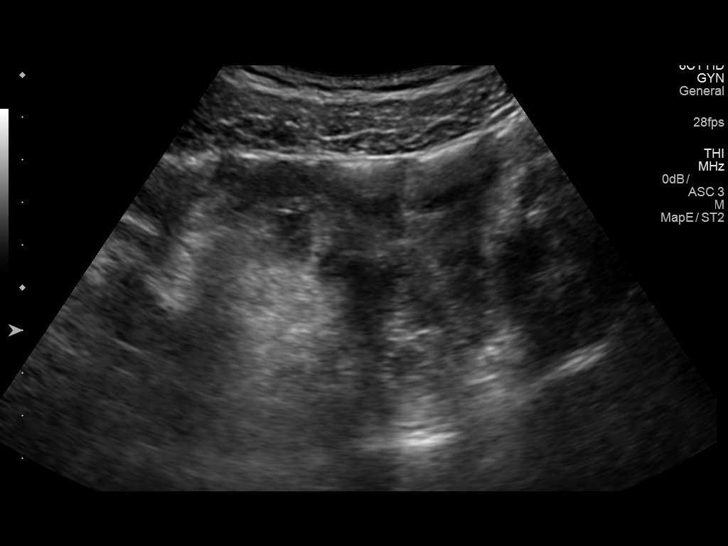
[im 33/65]
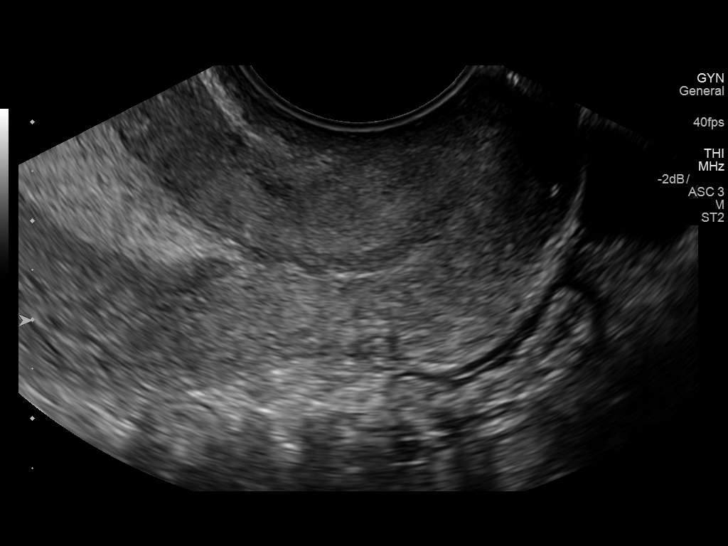
[im 38/65]
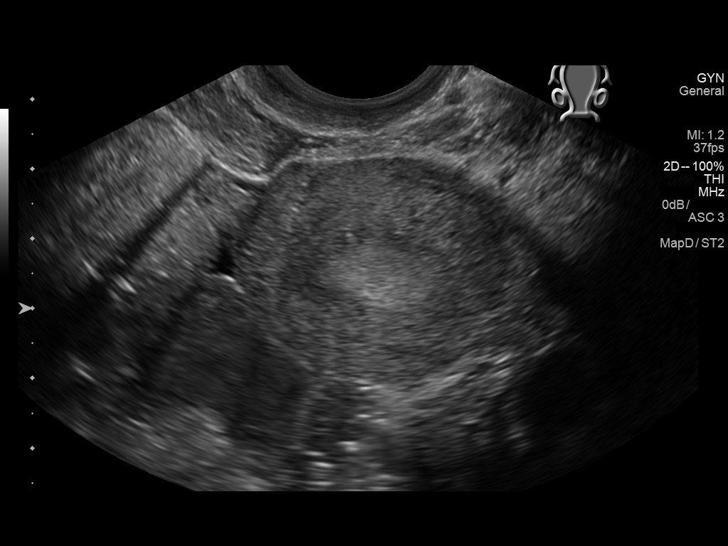
[im 43/65]
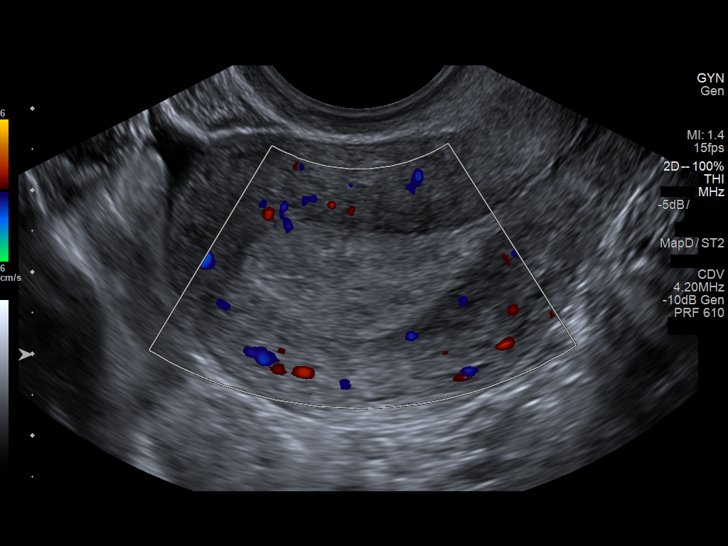
[im 49/65]
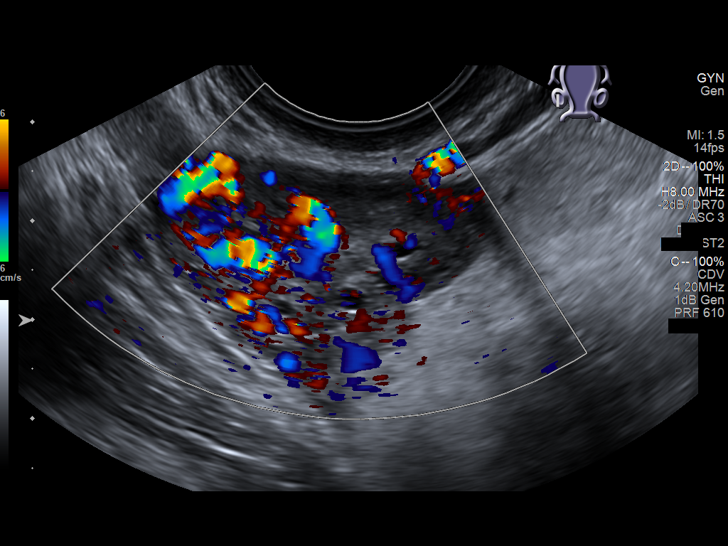
[im 54/65]
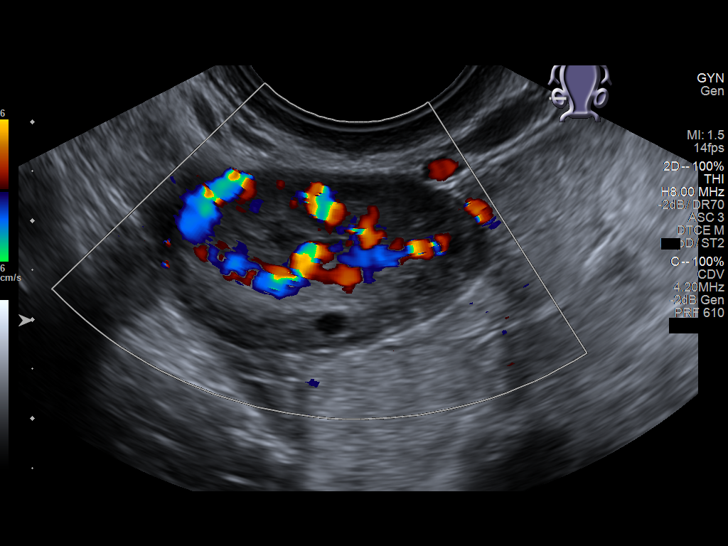
[im 59/65]
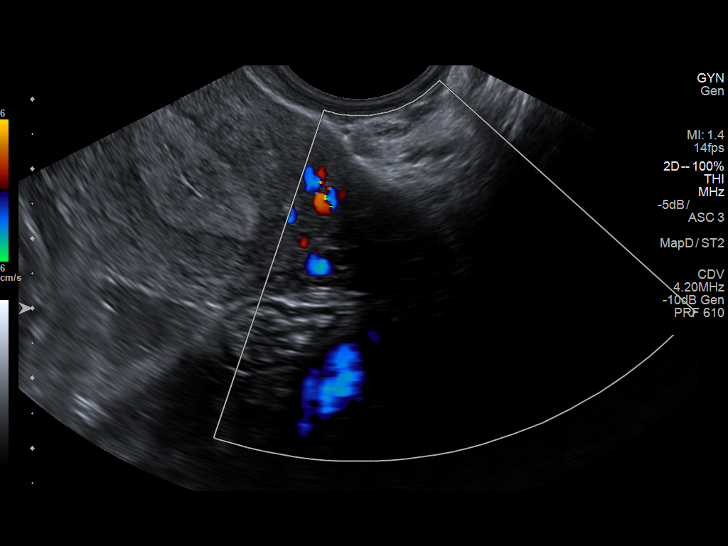
[im 65/65]
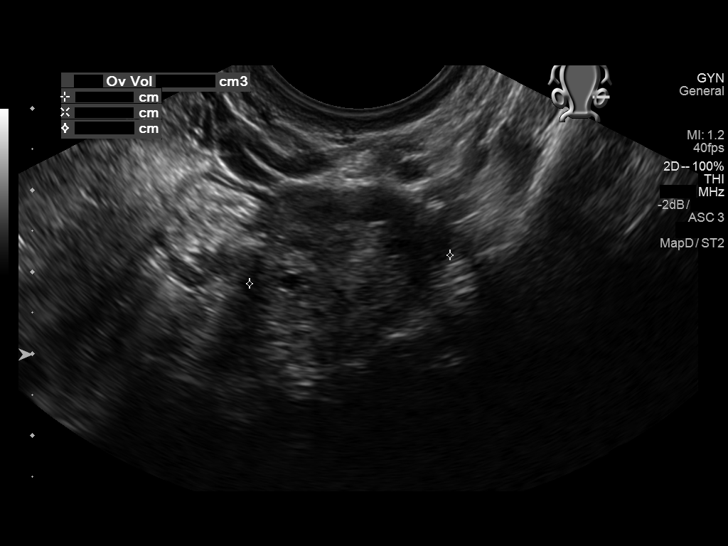

[13 of 25 positions shown; findings below may reference images not displayed]

FINDINGS: Uterus

Measurements: 5.8 x 3.9 x 5.0 cm, within normal limits.. No fibroids
or other mass visualized.

Endometrium

Thickness: 13 mm, upper limits of normal. No focal abnormality
visualized.

Right ovary

Measurements: 3.4 x 1.8 x 3.0 cm, within normal limits. Normal
appearance/no adnexal mass.

Left ovary

Measurements: 4.0 x 2.0 x 2.4 cm, within normal limits. Normal
appearance/no adnexal mass.

Pulsed Doppler evaluation of both ovaries demonstrates normal
low-resistance arterial and venous waveforms.

Other findings

No abnormal free fluid.
IMPRESSION: 1. Normal premenopausal appearance of the uterus.
2. Normal appearance of the ovaries bilaterally including pulsed
Doppler evaluation with normal arterial and venous waveforms.
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
## 2023-07-15 ENCOUNTER — Other Ambulatory Visit: Payer: Self-pay

## 2023-07-15 ENCOUNTER — Ambulatory Visit
Admission: EM | Admit: 2023-07-15 | Discharge: 2023-07-15 | Disposition: A | Discharge status: Home / Self Care | Payer: BLUE CROSS/BLUE SHIELD | Attending: Family Medicine | Admitting: Family Medicine

## 2023-07-15 DIAGNOSIS — L259 Unspecified contact dermatitis, unspecified cause: Secondary | ICD-10-CM

## 2023-07-15 MED ORDER — CETIRIZINE HCL 10 MG PO TABS: 10.0000 mg | ORAL_TABLET | Freq: Two times a day (BID) | ORAL | 0 refills | Status: AC

## 2023-07-15 MED ORDER — DEXAMETHASONE SODIUM PHOSPHATE 10 MG/ML IJ SOLN
10.0000 mg | Freq: Once | INTRAMUSCULAR | Status: AC
  Administered 2023-07-15: 10 mg via INTRAMUSCULAR

## 2023-07-15 MED ORDER — FAMOTIDINE 20 MG PO TABS: 20.0000 mg | ORAL_TABLET | Freq: Two times a day (BID) | ORAL | 0 refills | Status: AC

## 2023-07-15 MED ORDER — DOXYCYCLINE HYCLATE 100 MG PO CAPS: 100.0000 mg | ORAL_CAPSULE | Freq: Two times a day (BID) | ORAL | 0 refills | Status: AC

## 2023-07-15 MED ORDER — TRIAMCINOLONE ACETONIDE 0.1 % EX OINT: 1.0000 | TOPICAL_OINTMENT | Freq: Four times a day (QID) | CUTANEOUS | 0 refills | Status: AC

## 2023-07-15 MED ORDER — HYDROXYZINE HCL 25 MG PO TABS: 25.0000 mg | ORAL_TABLET | Freq: Every evening | ORAL | 0 refills | Status: AC | PRN

## 2023-07-15 MED ORDER — PREDNISONE 10 MG (21) PO TBPK: ORAL_TABLET | Freq: Every day | ORAL | 0 refills | Status: AC

## 2023-07-15 NOTE — Discharge Instructions (Addendum)
Stop by the pharmacy to pick up your prescriptions and take as prescribed.  Take Benadryl with you at all times, if you are not able to afford an EPI pen.

## 2023-07-15 NOTE — ED Triage Notes (Signed)
Pt here for rash that started on Saturday went to Urgent care and was given cephalexin, fluconazole and triamcinolone cream and rash continues to get progressively worse and itchy.

## 2023-07-15 NOTE — ED Provider Notes (Signed)
[facility]    CSN: [id number] Arrival date & time: 07/15/23  1513      History   Chief Complaint Chief Complaint  Patient presents with   Rash    HPI Annette Hicks is a 32 y.o. female.   HPI  Annette Hicks presents for rash after falling down a stairwell Friday night. The next day she woke up with a rash.  She was seen in the urgent care.  Feels like it progressing. She has been applying steroid ointments but is not getting any relief. She is allergic to "most of outside."  She notes that she has a lot of allergies and has eczema.   She fell on the wood and has allergies to trees. She went to urgent care and got a steroid cream and they gave her antibiotics but these are not helping.  She is not able to sleep at night due to the constant itching.  There has been no fever, joint pain, nausea, vomiting, diarrhea, hives.         Past Medical History:  Diagnosis Date   Asthma     There are no problems to display for this patient.   Past Surgical History:  Procedure Laterality Date   MOUTH SURGERY     wisdom teeth removal    OB History     Gravida  1   Para      Term      Preterm      AB  1   Living         SAB      IAB      Ectopic      Multiple      Live Births               Home Medications    Prior to Admission medications   Medication Sig Start Date End Date Taking? Authorizing Provider  albuterol (PROVENTIL HFA;VENTOLIN HFA) 108 (90 Base) MCG/ACT inhaler Inhale 2 puffs into the lungs every 6 (six) hours as needed for wheezing or shortness of breath. 01/08/17  Yes Governor Rooks, MD  cetirizine (ZYRTEC) 10 MG tablet Take 1 tablet (10 mg total) by mouth 2 (two) times daily. 07/15/23  Yes Arnav Cregg, DO  doxycycline (VIBRAMYCIN) 100 MG capsule Take 1 capsule (100 mg total) by mouth 2 (two) times daily. 07/15/23  Yes Zackry Deines, DO  famotidine (PEPCID) 20 MG tablet Take 1 tablet (20 mg total) by mouth 2 (two) times  daily. 07/15/23  Yes Franki Stemen, DO  hydrOXYzine (ATARAX) 25 MG tablet Take 1 tablet (25 mg total) by mouth at bedtime as needed for itching. 07/15/23  Yes Jacqueline Delapena, DO  predniSONE (STERAPRED UNI-PAK 21 TAB) 10 MG (21) TBPK tablet Take by mouth daily. Take 6 tabs by mouth daily for 1, then 5 tabs for 1 day, then 4 tabs for 1 day, then 3 tabs for 1 day, then 2 tabs for 1 day, then 1 tab for 1 day. 07/15/23  Yes Brittaney Beaulieu, DO  triamcinolone ointment (KENALOG) 0.1 % Apply 1 Application topically 4 (four) times daily. 07/15/23  Yes Katha Cabal, DO    Family History History reviewed. No pertinent family history.  Social History Social History   Tobacco Use   Smoking status: Never   Smokeless tobacco: Never  Substance Use Topics   Alcohol use: Yes   Drug use: No     Allergies   Gadolinium derivatives, Iodine, and Radish [radish (raphanus sativus)]  Review of Systems Review of Systems :negative unless otherwise stated in HPI.      Physical Exam Triage Vital Signs ED Triage Vitals  Encounter Vitals Group     BP 07/15/23 1526 137/79     Systolic BP Percentile --      Diastolic BP Percentile --      Pulse Rate 07/15/23 1526 94     Resp 07/15/23 1526 20     Temp 07/15/23 1526 98.2 F (36.8 C)     Temp src --      SpO2 07/15/23 1526 100 %     Weight --      Height --      Head Circumference --      Peak Flow --      Pain Score 07/15/23 1527 0     Pain Loc --      Pain Education --      Exclude from Growth Chart --    No data found.  Updated Vital Signs BP 137/79   Pulse 94   Temp 98.2 F (36.8 C)   Resp 20   LMP 07/04/2023 (Exact Date)   SpO2 100%   Visual Acuity Right Eye Distance:   Left Eye Distance:   Bilateral Distance:    Right Eye Near:   Left Eye Near:    Bilateral Near:     Physical Exam  GEN: alert, well appearing female, in no acute distress  HENT: Moist mucous membranes, no oropharyngeal lesions or erythema, no nasal  discharge EYES: extra occular movements intact, no scleral injection CV: regular rate and rhythm RESP: no increased work of breathing, clear to ascultation bilaterally MSK: no extremity edema, no gross deformities NEURO: alert, oriented moves all extremities appropriately SKIN: warm and dry; erythematous patches on bilateral upper extremities, trunk and anterior chest wall.  No drainage, crusting, blisters, fluctuance or discharge.    UC Treatments / Results  Labs (all labs ordered are listed, but only abnormal results are displayed) Labs Reviewed - No data to display  EKG   Radiology No results found.  Procedures Procedures (including critical care time)  Medications Ordered in UC Medications  dexamethasone (DECADRON) injection 10 mg (10 mg Intramuscular Given 07/15/23 1630)    Initial Impression / Assessment and Plan / UC Course  I have reviewed the triage vital signs and the nursing notes.  Pertinent labs & imaging results that were available during my care of the patient were reviewed by me and considered in my medical decision making (see chart for details).     Patient is a 32 y.o. femalewho presents for rash.  She has history of multiple allergies to the environment as well as eczema.  Overall, patient is well-appearing and well-hydrated.  Vital signs stable.  Aden is afebrile.  Exam concerning for contact dermatitis.  Given Decadron IM 10 mg here.  Treat with steroid ointment and steroid taper.  Hydroxyzine at bedtime.  Pepcid and Zyrtec twice daily as needed for itching.  This may be an atypical presentation of impetigo.  Switch to doxycycline.  I do not feel the patient needs blood work at this time.  No known family history of autoimmune disease.   Reviewed expectations regarding course of current medical issues.  All questions asked were answered.  Outlined signs and symptoms indicating need for more acute intervention. Patient verbalized understanding. After  Visit Summary given.   Final Clinical Impressions(s) / UC Diagnoses   Final diagnoses:  Contact dermatitis, unspecified contact  dermatitis type, unspecified trigger     Discharge Instructions      Stop by the pharmacy to pick up your prescriptions and take as prescribed.  Take Benadryl with you at all times, if you are not able to afford an EPI pen.        ED Prescriptions     Medication Sig Dispense Auth. Provider   predniSONE (STERAPRED UNI-PAK 21 TAB) 10 MG (21) TBPK tablet Take by mouth daily. Take 6 tabs by mouth daily for 1, then 5 tabs for 1 day, then 4 tabs for 1 day, then 3 tabs for 1 day, then 2 tabs for 1 day, then 1 tab for 1 day. 21 tablet Geena Weinhold, DO   hydrOXYzine (ATARAX) 25 MG tablet Take 1 tablet (25 mg total) by mouth at bedtime as needed for itching. 20 tablet Daelyn Pettaway, DO   famotidine (PEPCID) 20 MG tablet Take 1 tablet (20 mg total) by mouth 2 (two) times daily. 30 tablet Alejandra Hunt, DO   cetirizine (ZYRTEC) 10 MG tablet Take 1 tablet (10 mg total) by mouth 2 (two) times daily. 30 tablet Raseel Jans, DO   triamcinolone ointment (KENALOG) 0.1 % Apply 1 Application topically 4 (four) times daily. 60 g Channelle Bottger, DO   doxycycline (VIBRAMYCIN) 100 MG capsule Take 1 capsule (100 mg total) by mouth 2 (two) times daily. 20 capsule Katha Cabal, DO      PDMP not reviewed this encounter.              Katha Cabal, DO 07/18/23 0221
